# Patient Record
Sex: Male | Born: 1995 | Race: Black or African American | Hispanic: No | Marital: Single | Smoking: Former smoker
Health system: Southern US, Community
[De-identification: ages and names within clinical notes are randomized; demographics above are authoritative.]

## PROBLEM LIST (undated history)

## (undated) DIAGNOSIS — J45909 Unspecified asthma, uncomplicated: Secondary | ICD-10-CM

## (undated) HISTORY — PX: LEG SURGERY: SHX1003

---
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
## 2014-06-15 ENCOUNTER — Emergency Department (HOSPITAL_COMMUNITY)
Admission: EM | Admit: 2014-06-15 | Discharge: 2014-06-15 | Disposition: A | Discharge status: Home / Self Care | Payer: Medicaid Other | Attending: Emergency Medicine | Admitting: Emergency Medicine

## 2014-06-15 ENCOUNTER — Emergency Department (HOSPITAL_COMMUNITY): Payer: Medicaid Other

## 2014-06-15 ENCOUNTER — Encounter (HOSPITAL_COMMUNITY): Payer: Self-pay | Admitting: Emergency Medicine

## 2014-06-15 DIAGNOSIS — Z87891 Personal history of nicotine dependence: Secondary | ICD-10-CM | POA: Insufficient documentation

## 2014-06-15 DIAGNOSIS — S299XXA Unspecified injury of thorax, initial encounter: Secondary | ICD-10-CM | POA: Diagnosis not present

## 2014-06-15 DIAGNOSIS — Y998 Other external cause status: Secondary | ICD-10-CM | POA: Insufficient documentation

## 2014-06-15 DIAGNOSIS — S8001XA Contusion of right knee, initial encounter: Secondary | ICD-10-CM

## 2014-06-15 DIAGNOSIS — Y92410 Unspecified street and highway as the place of occurrence of the external cause: Secondary | ICD-10-CM | POA: Insufficient documentation

## 2014-06-15 DIAGNOSIS — S8991XA Unspecified injury of right lower leg, initial encounter: Secondary | ICD-10-CM | POA: Diagnosis present

## 2014-06-15 DIAGNOSIS — Z791 Long term (current) use of non-steroidal anti-inflammatories (NSAID): Secondary | ICD-10-CM | POA: Diagnosis not present

## 2014-06-15 DIAGNOSIS — Y9389 Activity, other specified: Secondary | ICD-10-CM | POA: Insufficient documentation

## 2014-06-15 DIAGNOSIS — J45909 Unspecified asthma, uncomplicated: Secondary | ICD-10-CM | POA: Diagnosis not present

## 2014-06-15 MED ORDER — IBUPROFEN 200 MG PO TABS
600.0000 mg | ORAL_TABLET | Freq: Once | ORAL | Status: AC
  Administered 2014-06-15: 600 mg via ORAL
  Filled 2014-06-15: qty 3
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
## 2014-06-15 NOTE — ED Notes (Signed)
MD at bedside. 

## 2014-06-15 NOTE — ED Notes (Signed)
MD Wofford at bedside.  

## 2014-06-15 NOTE — Discharge Instructions (Signed)
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
## 2014-06-15 NOTE — ED Notes (Signed)
Pt alert, oriented,and ambulatory upon DC. He was advised to follow up with a PCP if not better.
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
## 2014-06-15 NOTE — ED Notes (Signed)
Pt transported to XRAY °

## 2015-07-17 ENCOUNTER — Emergency Department (HOSPITAL_COMMUNITY)
Admission: EM | Admit: 2015-07-17 | Discharge: 2015-07-17 | Disposition: A | Discharge status: Home / Self Care | Payer: Medicaid Other | Attending: Emergency Medicine | Admitting: Emergency Medicine

## 2015-07-17 ENCOUNTER — Emergency Department (HOSPITAL_COMMUNITY): Payer: Medicaid Other

## 2015-07-17 ENCOUNTER — Encounter (HOSPITAL_COMMUNITY): Payer: Self-pay | Admitting: Emergency Medicine

## 2015-07-17 DIAGNOSIS — W25XXXA Contact with sharp glass, initial encounter: Secondary | ICD-10-CM | POA: Diagnosis not present

## 2015-07-17 DIAGNOSIS — Z23 Encounter for immunization: Secondary | ICD-10-CM | POA: Diagnosis not present

## 2015-07-17 DIAGNOSIS — S61421A Laceration with foreign body of right hand, initial encounter: Secondary | ICD-10-CM | POA: Diagnosis not present

## 2015-07-17 DIAGNOSIS — Y998 Other external cause status: Secondary | ICD-10-CM | POA: Diagnosis not present

## 2015-07-17 DIAGNOSIS — S50811A Abrasion of right forearm, initial encounter: Secondary | ICD-10-CM | POA: Insufficient documentation

## 2015-07-17 DIAGNOSIS — S60511A Abrasion of right hand, initial encounter: Secondary | ICD-10-CM | POA: Insufficient documentation

## 2015-07-17 DIAGNOSIS — S6991XA Unspecified injury of right wrist, hand and finger(s), initial encounter: Secondary | ICD-10-CM | POA: Diagnosis present

## 2015-07-17 DIAGNOSIS — Y92810 Car as the place of occurrence of the external cause: Secondary | ICD-10-CM | POA: Insufficient documentation

## 2015-07-17 DIAGNOSIS — Y9389 Activity, other specified: Secondary | ICD-10-CM | POA: Diagnosis not present

## 2015-07-17 DIAGNOSIS — F17210 Nicotine dependence, cigarettes, uncomplicated: Secondary | ICD-10-CM | POA: Diagnosis not present

## 2015-07-17 DIAGNOSIS — J45909 Unspecified asthma, uncomplicated: Secondary | ICD-10-CM | POA: Diagnosis not present

## 2015-07-17 DIAGNOSIS — T07XXXA Unspecified multiple injuries, initial encounter: Secondary | ICD-10-CM

## 2015-07-17 MED ORDER — LIDOCAINE HCL 1 % IJ SOLN
INTRAMUSCULAR | Status: AC
  Administered 2015-07-17: 20 mL
  Filled 2015-07-17: qty 20

## 2015-07-17 MED ORDER — TETANUS-DIPHTH-ACELL PERTUSSIS 5-2.5-18.5 LF-MCG/0.5 IM SUSP
0.5000 mL | Freq: Once | INTRAMUSCULAR | Status: AC
  Administered 2015-07-17: 0.5 mL via INTRAMUSCULAR
  Filled 2015-07-17: qty 0.5

## 2015-07-17 MED ORDER — LIDOCAINE HCL (PF) 1 % IJ SOLN: 5.0000 mL | Freq: Once | INTRAMUSCULAR | Status: DC

## 2015-07-17 MED ORDER — CEPHALEXIN 500 MG PO CAPS: 500.0000 mg | ORAL_CAPSULE | Freq: Four times a day (QID) | ORAL | Status: DC
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
## 2015-07-17 NOTE — Discharge Instructions (Signed)
Take the prescribed medication as directed.  Monitor hand for any signs of infection. Return to the ED for new or worsening symptoms.
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
## 2015-10-05 ENCOUNTER — Encounter (HOSPITAL_COMMUNITY): Payer: Self-pay | Admitting: *Deleted

## 2015-10-05 ENCOUNTER — Ambulatory Visit (HOSPITAL_COMMUNITY)
Admission: EM | Admit: 2015-10-05 | Discharge: 2015-10-05 | Disposition: A | Discharge status: Home / Self Care | Payer: No Typology Code available for payment source | Attending: Family Medicine | Admitting: Family Medicine

## 2015-10-05 DIAGNOSIS — S66902A Unspecified injury of unspecified muscle, fascia and tendon at wrist and hand level, left hand, initial encounter: Secondary | ICD-10-CM

## 2015-10-05 NOTE — Discharge Instructions (Signed)
Wear splint at all times, see orthopedist next week for recheck

## 2015-10-05 NOTE — ED Provider Notes (Signed)
CSN: [id number]     Arrival date & time 10/05/15  1157 History   First MD Initiated Contact with Patient 10/05/15 1222     Chief Complaint  Patient presents with  . Finger Injury   (Consider location/radiation/quality/duration/timing/severity/associated sxs/prior Treatment) Patient is a 20 y.o. male presenting with hand pain. The history is provided by the patient.  Hand Pain This is a new problem. The current episode started more than 1 week ago (2.5 wk ago jammed finger and reinjured with deformity , here for eval. ). The problem has been gradually worsening.    Past Medical History  Diagnosis Date  . Asthma     sport-induced   Past Surgical History  Procedure Laterality Date  . Leg surgery     No family history on file. Social History  Substance Use Topics  . Smoking status: Former Smoker    Types: Cigarettes  . Smokeless tobacco: None  . Alcohol Use: No    Review of Systems  Constitutional: Negative.   Musculoskeletal: Positive for joint swelling.  Skin: Negative.   All other systems reviewed and are negative.   Allergies  Review of patient's allergies indicates no known allergies.  Home Medications   Prior to Admission medications   Medication Sig Start Date End Date Taking? Authorizing Provider  albuterol (PROVENTIL HFA;VENTOLIN HFA) 108 (90 Base) MCG/ACT inhaler Inhale into the lungs every 6 (six) hours as needed for wheezing or shortness of breath.   Yes Historical Provider, MD  cephALEXin (KEFLEX) 500 MG capsule Take 1 capsule (500 mg total) by mouth 4 (four) times daily. 07/17/15   Garlon Hatchet, PA-C  ibuprofen (ADVIL,MOTRIN) 800 MG tablet Take 1 tablet (800 mg total) by mouth 3 (three) times daily. Patient not taking: Reported on 06/15/2014 10/11/13   Marisa Severin, MD  oxyCODONE-acetaminophen (PERCOCET/ROXICET) 5-325 MG per tablet Take 2 tablets by mouth every 4 (four) hours as needed for severe pain. Patient not taking: Reported on 06/15/2014 10/11/13   Marisa Severin, MD   Meds Ordered and Administered this Visit  Medications - No data to display  BP 123/81 mmHg  Pulse 63  Temp(Src) 98.4 F (36.9 C) (Oral)  Resp 16  SpO2 99% No data found.   Physical Exam  Constitutional: He is oriented to person, place, and time. He appears well-developed and well-nourished.  Musculoskeletal: He exhibits tenderness.       Hands: Neurological: He is alert and oriented to person, place, and time.  Skin: Skin is warm and dry.  Nursing note and vitals reviewed.   ED Course  Procedures (including critical care time)  Labs Review Labs Reviewed - No data to display  Imaging Review No results found.   Visual Acuity Review  Right Eye Distance:   Left Eye Distance:   Bilateral Distance:    Right Eye Near:   Left Eye Near:    Bilateral Near:         MDM   1. Injury of tendon of left hand, initial encounter    Splinted finger in extension.   Linna Hoff, MD 10/05/15 1239

## 2015-10-05 NOTE — ED Notes (Signed)
Reports jamming left 5th finger approx 2.5 wks ago; last week re-injured finger while "playing around with brother".  C/O continued pain; deformity to distal finger at DIP joint.
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
## 2022-02-15 DIAGNOSIS — I498 Other specified cardiac arrhythmias: Secondary | ICD-10-CM | POA: Diagnosis not present

## 2022-02-15 DIAGNOSIS — R109 Unspecified abdominal pain: Secondary | ICD-10-CM | POA: Diagnosis not present

## 2022-02-15 DIAGNOSIS — R1110 Vomiting, unspecified: Secondary | ICD-10-CM | POA: Diagnosis not present

## 2022-02-17 DIAGNOSIS — R109 Unspecified abdominal pain: Secondary | ICD-10-CM | POA: Diagnosis not present

## 2022-02-17 DIAGNOSIS — F22 Delusional disorders: Secondary | ICD-10-CM | POA: Diagnosis not present

## 2022-02-25 ENCOUNTER — Ambulatory Visit: Payer: Medicaid Other | Admitting: Internal Medicine

## 2022-02-25 ENCOUNTER — Encounter: Payer: Self-pay | Admitting: Internal Medicine

## 2022-02-25 VITALS — BP 108/62 | HR 82 | Temp 98.3°F | Resp 16 | Ht 72.0 in | Wt 142.0 lb

## 2022-02-25 DIAGNOSIS — M25531 Pain in right wrist: Secondary | ICD-10-CM | POA: Insufficient documentation

## 2022-02-25 DIAGNOSIS — S63529A Sprain of radiocarpal joint of unspecified wrist, initial encounter: Secondary | ICD-10-CM | POA: Diagnosis not present

## 2022-02-25 NOTE — Progress Notes (Unsigned)
   Office Visit  Subjective   Patient ID: Jerry Mcdaniel   DOB: 05/18/1995   Age: 26 y.o.   MRN: [id number]   Chief Complaint Chief Complaint  Patient presents with   New Patient (Initial Visit)     History of Present Illness Jerry Mcdaniel is a 26 yo male who comes in today to establish care and to discuss a right wrist injury.  He states he was playing basketball a little more than a month ago when he came down and landed with an outstretched hand palm first on the ground.  The day it happened, he had some swelling but no pain.  He started feeling pain days later.  He states this worsened and he presented to the [facility] on 02/03/2022.  They did a xrays of his right wrist where there was no fracture or dislocation.  They asked him to wear a wrist brace and started him on naproxen 500mg  BID which he took that for 10 days.  He states that this did not help.  He states he iced it a few times but today he states there has been no improvement in his pain.  If he turns his wrist in a certain way, he will have a dull aching pain.       Past Medical History Past Medical History:  Diagnosis Date   Asthma    sport-induced     Allergies No Known Allergies   Review of Systems Review of Systems  Musculoskeletal:  Positive for joint pain. Negative for back pain and neck pain.  Neurological:  Negative for dizziness, weakness and headaches.       Objective:    Vitals BP 108/62   Pulse 82   Temp 98.3 F (36.8 C)   Resp 16   Ht 6' (1.829 m)   Wt 142 lb 0.2 oz (64.4 kg)   SpO2 98%   BMI 19.26 kg/m    Physical Examination Physical Exam Constitutional:      Appearance: Normal appearance.  Musculoskeletal:     Comments: He has pain over the distal aspect of his ulnar area.  Neurological:     Mental Status: He is alert.        Assessment & Plan:   Right wrist pain I spoke to Dr. on the phone and he wants to see the patient in the morning for evaluation.     No follow-ups on file.   Loralie Champagne, MD

## 2022-02-25 NOTE — Assessment & Plan Note (Signed)
I spoke to Dr. Loralie Champagne on the phone and he wants to see the patient in the morning for evaluation.

## 2022-02-26 DIAGNOSIS — S63521A Sprain of radiocarpal joint of right wrist, initial encounter: Secondary | ICD-10-CM | POA: Diagnosis not present

## 2022-08-18 DIAGNOSIS — M546 Pain in thoracic spine: Secondary | ICD-10-CM | POA: Diagnosis not present

## 2022-08-18 DIAGNOSIS — S39012A Strain of muscle, fascia and tendon of lower back, initial encounter: Secondary | ICD-10-CM | POA: Diagnosis not present

## 2022-10-19 ENCOUNTER — Encounter (HOSPITAL_COMMUNITY): Payer: Self-pay

## 2022-10-19 ENCOUNTER — Ambulatory Visit (HOSPITAL_COMMUNITY)
Admission: EM | Admit: 2022-10-19 | Discharge: 2022-10-19 | Disposition: A | Discharge status: Home / Self Care | Payer: Medicaid Other | Attending: Physician Assistant | Admitting: Physician Assistant

## 2022-10-19 DIAGNOSIS — S39012D Strain of muscle, fascia and tendon of lower back, subsequent encounter: Secondary | ICD-10-CM | POA: Diagnosis not present

## 2022-10-19 DIAGNOSIS — M545 Low back pain, unspecified: Secondary | ICD-10-CM

## 2022-10-19 MED ORDER — LIDOCAINE 5 % EX PTCH: 1.0000 | MEDICATED_PATCH | CUTANEOUS | 0 refills | Status: DC

## 2022-10-19 MED ORDER — IBUPROFEN 800 MG PO TABS: 800.0000 mg | ORAL_TABLET | Freq: Three times a day (TID) | ORAL | 0 refills | Status: DC

## 2022-10-19 MED ORDER — METHOCARBAMOL 500 MG PO TABS: 500.0000 mg | ORAL_TABLET | Freq: Two times a day (BID) | ORAL | 0 refills | Status: DC

## 2022-10-19 NOTE — ED Provider Notes (Signed)
[facility]    CSN: [id number] Arrival date & time: 10/19/22  8469      History   Chief Complaint Chief Complaint  Patient presents with   Back Pain    HPI Jerry Mcdaniel is a 27 y.o. male.   Patient presents today with a prolonged history of intermittent back pain.  He reports that this has been ongoing ever since he got out of school and he will intermittently have flares of this.  Currently pain is rated 7 on a 0-10 pain scale, described as aching, no aggravating relieving factors identified.  He denies any recent trauma but does state that he works a physically demanding job in [employer] and this tends to exacerbate his symptoms.  He has tried Tylenol and ibuprofen with minimal improvement of symptoms.  He was seen by different urgent care on 08/18/2022 and given tizanidine that was ineffective.  He denies any bowel/bladder incontinence, lower extremity weakness, saddle anesthesia.  Denies previous injury or surgery involving his back.  Denies any history of malignancy.   Back Pain Associated symptoms: no abdominal pain, no fever, no numbness and no weakness     Past Medical History:  Diagnosis Date   Asthma    sport-induced    Patient Active Problem List   Diagnosis Date Noted   Right wrist pain 02/25/2022    Past Surgical History:  Procedure Laterality Date   LEG SURGERY         Home Medications    Prior to Admission medications   Medication Sig Start Date End Date Taking? Authorizing Provider  ibuprofen (ADVIL) 800 MG tablet Take 1 tablet (800 mg total) by mouth 3 (three) times daily. 10/19/22  Yes Percy Comp K, PA-C  lidocaine (LIDODERM) 5 % Place 1 patch onto the skin daily. Remove & Discard patch within 12 hours or as directed by MD 10/19/22  Yes Samia Kukla, Denny Peon K, PA-C  methocarbamol (ROBAXIN) 500 MG tablet Take 1 tablet (500 mg total) by mouth 2 (two) times daily. 10/19/22  Yes Arie Gable, Noberto Retort, PA-C    Family History Family History  Family history  unknown: Yes    Social History Social History   Tobacco Use   Smoking status: Former   Smokeless tobacco: Current  Substance Use Topics   Alcohol use: No   Drug use: No    Types: Marijuana    Comment: Hx marijuana use per record     Allergies   Patient has no known allergies.   Review of Systems Review of Systems  Constitutional:  Negative for activity change, appetite change, fatigue and fever.  Gastrointestinal:  Negative for abdominal pain, diarrhea, nausea and vomiting.  Musculoskeletal:  Positive for back pain. Negative for arthralgias and myalgias.  Neurological:  Negative for weakness and numbness.     Physical Exam Triage Vital Signs ED Triage Vitals  Encounter Vitals Group     BP 10/19/22 1014 113/75     Systolic BP Percentile --      Diastolic BP Percentile --      Pulse Rate 10/19/22 1014 70     Resp 10/19/22 1014 16     Temp 10/19/22 1014 98.7 F (37.1 C)     Temp Source 10/19/22 1014 Oral     SpO2 10/19/22 1014 97 %     Weight 10/19/22 1014 142 lb (64.4 kg)     Height 10/19/22 1014 6' (1.829 m)     Head Circumference --  Peak Flow --      Pain Score 10/19/22 1013 7     Pain Loc --      Pain Education --      Exclude from Growth Chart --    No data found.  Updated Vital Signs BP 113/75 (BP Location: Left Arm)   Pulse 70   Temp 98.7 F (37.1 C) (Oral)   Resp 16   Ht 6' (1.829 m)   Wt 142 lb (64.4 kg)   SpO2 97%   BMI 19.26 kg/m   Visual Acuity Right Eye Distance:   Left Eye Distance:   Bilateral Distance:    Right Eye Near:   Left Eye Near:    Bilateral Near:     Physical Exam Vitals reviewed.  Constitutional:      General: He is awake.     Appearance: Normal appearance. He is well-developed. He is not ill-appearing.     Comments: Very pleasant male stated age no acute distress sitting comfortably in exam room  HENT:     Head: Normocephalic and atraumatic.     Mouth/Throat:     Pharynx: No oropharyngeal exudate,  posterior oropharyngeal erythema or uvula swelling.  Cardiovascular:     Rate and Rhythm: Normal rate and regular rhythm.     Heart sounds: Normal heart sounds, S1 normal and S2 normal. No murmur heard. Pulmonary:     Effort: Pulmonary effort is normal.     Breath sounds: Normal breath sounds. No stridor. No wheezing, rhonchi or rales.     Comments: Clear to auscultation bilaterally Musculoskeletal:     Cervical back: No tenderness or bony tenderness.     Thoracic back: Tenderness present. No bony tenderness.     Lumbar back: Tenderness present. No bony tenderness. Negative right straight leg raise test and negative left straight leg raise test.     Comments: Back: No pain precaution of vertebrae.  No deformity or step-off noted.  Mild/palpation throughout thoracic and lumbar paraspinal muscles.  No spasm noted.  Negative straight leg raise bilaterally.  Negative Faber bilaterally.  Neurological:     Mental Status: He is alert.  Psychiatric:        Behavior: Behavior is cooperative.      UC Treatments / Results  Labs (all labs ordered are listed, but only abnormal results are displayed) Labs Reviewed - No data to display  EKG   Radiology No results found.  Procedures Procedures (including critical care time)  Medications Ordered in UC Medications - No data to display  Initial Impression / Assessment and Plan / UC Course  I have reviewed the triage vital signs and the nursing notes.  Pertinent labs & imaging results that were available during my care of the patient were reviewed by me and considered in my medical decision making (see chart for details).     Patient is well-appearing, afebrile, nontoxic, nontachycardic.  Patient denies any alarm symptoms that warrant emergent evaluation or imaging.  Plain films were deferred as he denies any recent trauma and has no focal bony tenderness.  Suspect muscular etiology.  He was started on ibuprofen three times a day with  instruction to take additional NSAIDs with this medication due to risk of GI bleeding.  Can use acetaminophen/Tylenol for breakthrough pain.  Was prescribed Robaxin up to 2 times a day.  Discussed this can be sedating and he is not to drive or drink alcohol while taking it.  He was also given lidocaine patches for  symptom relief and we discussed that these should be placed for 12 hours during the day and then remove for 12 hours at night; use only 1 patch per 24 hours.  Recommended conservative treatment measures including heat, rest, stretch.  He is to follow-up with sports medicine if his symptoms or not improving quickly and was given contact information for local provider with instruction to call to schedule an appointment.  Discussed that if he has any worsening symptoms including severe pain, bowel/bladder incontinence, lower extremity weakness, saddle anesthesia he needs to be seen immediately.  Strict return precautions given.  Work excuse note provided.   Final Clinical Impressions(s) / UC Diagnoses   Final diagnoses:  Strain of lumbar region, subsequent encounter  Acute bilateral low back pain without sciatica     Discharge Instructions      I believe that you have injured the muscles in your back.  Please start ibuprofen 3 times a day.  Do not take other NSAIDs with this medication including aspirin, ibuprofen/Advil, naproxen/Aleve.  You can use acetaminophen/Tylenol for breakthrough pain.  Take Robaxin up to 2 times a day.  This will make you sleepy so do not drive or drink alcohol taking it.  Apply lidocaine patch for 12 hours during the day; remove this for 12 hours at night.  Use only 1 patch per 24 hours.  I recommend you follow-up with sports medicine; call to schedule an appointment.  If anything worsens and you have severe pain, going to the bathroom on yourself without noticing it, numbness or tingling in your legs, weakness in your legs you need to be seen immediately.  Someone  should call you to schedule an appoint with primary care.      ED Prescriptions     Medication Sig Dispense Auth. Provider   ibuprofen (ADVIL) 800 MG tablet Take 1 tablet (800 mg total) by mouth 3 (three) times daily. 21 tablet Ramisa Duman K, PA-C   methocarbamol (ROBAXIN) 500 MG tablet Take 1 tablet (500 mg total) by mouth 2 (two) times daily. 20 tablet Cordelia Bessinger K, PA-C   lidocaine (LIDODERM) 5 % Place 1 patch onto the skin daily. Remove & Discard patch within 12 hours or as directed by MD 30 patch Treyden Hakim K, PA-C      PDMP not reviewed this encounter.   Jeani Hawking, PA-C 10/19/22 1102

## 2022-10-19 NOTE — ED Triage Notes (Signed)
Patient here today with c/o LB pain since Thursday. Patient has a h/o LB pain off and on since being out of school, playing sports. 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 No known injury. He tried taking Tylenol and IBU with no relief.

## 2022-10-19 NOTE — Discharge Instructions (Signed)
I believe that you have injured the muscles in your back.  Please start ibuprofen 3 times a day.  Do not take other NSAIDs with this medication including aspirin, ibuprofen/Advil, naproxen/Aleve.  You can use acetaminophen/Tylenol for breakthrough pain.  Take Robaxin up to 2 times a day.  This will make you sleepy so do not drive or drink alcohol taking it.  Apply lidocaine patch for 12 hours during the day; remove this for 12 hours at night.  Use only 1 patch per 24 hours.  I recommend you follow-up with sports medicine; call to schedule an appointment.  If anything worsens and you have severe pain, going to the bathroom on yourself without noticing it, numbness or tingling in your legs, weakness in your legs you need to be seen immediately.  Someone should call you to schedule an appoint with primary care.

## 2023-05-03 ENCOUNTER — Ambulatory Visit: Payer: Medicaid Other | Admitting: Internal Medicine

## 2023-05-07 ENCOUNTER — Ambulatory Visit: Payer: Medicaid Other | Admitting: Internal Medicine

## 2023-05-18 ENCOUNTER — Ambulatory Visit: Payer: Medicaid Other | Admitting: Internal Medicine

## 2023-05-26 ENCOUNTER — Ambulatory Visit: Admitting: Internal Medicine

## 2023-06-04 ENCOUNTER — Encounter: Payer: Self-pay | Admitting: Internal Medicine

## 2023-06-04 ENCOUNTER — Ambulatory Visit: Admitting: Internal Medicine

## 2023-06-04 VITALS — BP 114/76 | HR 78 | Temp 98.4°F | Resp 16 | Ht 69.0 in | Wt 136.2 lb

## 2023-06-04 DIAGNOSIS — D696 Thrombocytopenia, unspecified: Secondary | ICD-10-CM

## 2023-06-04 DIAGNOSIS — J45990 Exercise induced bronchospasm: Secondary | ICD-10-CM | POA: Diagnosis not present

## 2023-06-04 DIAGNOSIS — R739 Hyperglycemia, unspecified: Secondary | ICD-10-CM

## 2023-06-04 DIAGNOSIS — Z0000 Encounter for general adult medical examination without abnormal findings: Secondary | ICD-10-CM | POA: Diagnosis not present

## 2023-06-04 DIAGNOSIS — Z6820 Body mass index (BMI) 20.0-20.9, adult: Secondary | ICD-10-CM | POA: Diagnosis not present

## 2023-06-04 NOTE — Assessment & Plan Note (Signed)
I want him to eat healthy and continue to exercise.

## 2023-06-04 NOTE — Assessment & Plan Note (Signed)
 He was noted to have some hyperglycemia on labs in 02/2022.  We will do a CMP and HgBA1c and FLP.

## 2023-06-04 NOTE — Assessment & Plan Note (Signed)
 He is really not effected by EIA at this time.  It is probably resolved.

## 2023-06-04 NOTE — Progress Notes (Signed)
 Office Visit  Subjective   Patient ID: HULET EHRMANN   DOB: 1995/10/23   Age: 28 y.o.   MRN: [id number]   Chief Complaint Chief Complaint  Patient presents with   Follow-up     History of Present Illness Mr. Cauthorn is a 28 yo male who comes in today for an annual exam.  His PMHx is significant for exercise induced asthma.  His last eye exam was done about 1 year ago and denies any problems with his vision.  There is no family history of colorectal cancer or prostate cancer.  He has never had a colonoscopy.  He denies any problems with urinating.  He does not exercise regularly but he does work [employer] representative.  He does not smoke.  There is no family history of CAD or stroke, diabetes or high cholesterol.  He denies any depression or anxiety.    He states he was diagnosed exerise induced asthma when he was about 28 years old.  He states when he would exercise, he would get wheezing, SOB and chest pain.  They placed him on albuterol inhaler back then and told him to use this before he exercises.  He has not used albuterol in over 10 years.  He denies any wheezing, SOB or other problems.  He does not wheeze when he gets respiratory illnesses.   I did see him in 02/2023 for a right wrist injury.  He was playing basketball a little more than a month prior when he came down and landed with an outstretched hand palm first on the ground.  The day it happened, he had some swelling but no pain.  He started feeling pain days later.  He states this worsened and he presented to the [facility] on 02/03/2022.  They did a xrays of his right wrist where there was no fracture or dislocation.  They asked him to wear a wrist brace and started him on naproxen 500mg  BID which he took that for 10 days.  He states that this did not help.  He states he iced it a few times but there was no improvement in his pain.  I did send him to ortho who repeated xrays and continued on his wrist brace and the patient states  this did resolve.  The patient was found at 28 years of age to have a soft tissue mass in left thigh.  They did a MRI of his thigh that found a 6.3cm mass in the left adductor longus muscle.  They biopsied this area and he tells me this was benign.  They noted at that time he had very mild thrombocytopenia with a value of 146.  This was all done in 2013.     Past Medical History Past Medical History:  Diagnosis Date   Asthma    sport-induced     Allergies Not on File   Medications No current outpatient medications on file.   Review of Systems Review of Systems  Constitutional:  Negative for chills, fever, malaise/fatigue and weight loss.  Eyes:  Negative for blurred vision and double vision.  Respiratory:  Negative for cough, hemoptysis, shortness of breath and wheezing.   Cardiovascular:  Negative for chest pain, palpitations and leg swelling.  Gastrointestinal:  Negative for abdominal pain, constipation, diarrhea, heartburn, nausea and vomiting.  Genitourinary:  Negative for frequency and hematuria.  Musculoskeletal:  Negative for myalgias.  Skin:  Negative for itching and rash.  Neurological:  Negative for dizziness, weakness and  headaches.  Endo/Heme/Allergies:  Negative for polydipsia.  Psychiatric/Behavioral:  Negative for depression. The patient is not nervous/anxious.        Objective:    Vitals BP 114/76   Pulse 78   Temp 98.4 F (36.9 C)   Resp 16   Ht 5\' 9"  (1.753 m)   Wt 136 lb 3.2 oz (61.8 kg)   SpO2 98%   BMI 20.11 kg/m    Physical Examination Physical Exam     Assessment & Plan:   Exercise-induced asthma He is really not effected by EIA at this time.  It is probably resolved.  Thrombocytopenia (HCC) He was noted in 2013 to have mild thrombocytopenia.  I looked at his labs when he was at [facility] in 2023 and he still had some mild thrombocytopenia.  We will repeat his CBC today.  Annual physical exam Health maintenance discussed.  We will obtain  some yearly labs.  Hyperglycemia He was noted to have some hyperglycemia on labs in 02/2022.  We will do a CMP and HgBA1c and FLP.  BMI 20.0-20.9, adult I want him to eat healthy and continue to exercise.    Return in about 1 year (around 06/05/2024) for annual.   Crist Fat, MD

## 2023-06-04 NOTE — Assessment & Plan Note (Signed)
 He was noted in 2013 to have mild thrombocytopenia.  I looked at his labs when he was at [facility] in 2023 and he still had some mild thrombocytopenia.  We will repeat his CBC today.

## 2023-06-04 NOTE — Assessment & Plan Note (Signed)
 Health maintenance discussed.  We will obtain some yearly labs.

## 2023-06-05 LAB — CBC WITH DIFFERENTIAL/PLATELET
Basophils Absolute: 0 10*3/uL (ref 0.0–0.2)
Basos: 0 %
EOS (ABSOLUTE): 0.1 10*3/uL (ref 0.0–0.4)
Eos: 1 %
Hematocrit: 44.5 % (ref 37.5–51.0)
Hemoglobin: 15 g/dL (ref 13.0–17.7)
Immature Grans (Abs): 0 10*3/uL (ref 0.0–0.1)
Immature Granulocytes: 0 %
Lymphocytes Absolute: 2.6 10*3/uL (ref 0.7–3.1)
Lymphs: 31 %
MCH: 31.8 pg (ref 26.6–33.0)
MCHC: 33.7 g/dL (ref 31.5–35.7)
MCV: 94 fL (ref 79–97)
Monocytes Absolute: 0.6 10*3/uL (ref 0.1–0.9)
Monocytes: 7 %
Neutrophils Absolute: 5 10*3/uL (ref 1.4–7.0)
Neutrophils: 61 %
Platelets: 143 10*3/uL — ABNORMAL LOW (ref 150–450)
RBC: 4.72 x10E6/uL (ref 4.14–5.80)
RDW: 12.8 % (ref 11.6–15.4)
WBC: 8.4 10*3/uL (ref 3.4–10.8)

## 2023-06-05 LAB — CMP14 + ANION GAP
ALT: 20 IU/L (ref 0–44)
AST: 23 IU/L (ref 0–40)
Albumin: 4.7 g/dL (ref 4.3–5.2)
Alkaline Phosphatase: 73 IU/L (ref 44–121)
Anion Gap: 14 mmol/L (ref 10.0–18.0)
BUN/Creatinine Ratio: 13 (ref 9–20)
BUN: 12 mg/dL (ref 6–20)
Bilirubin Total: 0.3 mg/dL (ref 0.0–1.2)
CO2: 24 mmol/L (ref 20–29)
Calcium: 9.2 mg/dL (ref 8.7–10.2)
Chloride: 101 mmol/L (ref 96–106)
Creatinine, Ser: 0.92 mg/dL (ref 0.76–1.27)
Globulin, Total: 2.5 g/dL (ref 1.5–4.5)
Glucose: 85 mg/dL (ref 70–99)
Potassium: 4 mmol/L (ref 3.5–5.2)
Sodium: 139 mmol/L (ref 134–144)
Total Protein: 7.2 g/dL (ref 6.0–8.5)
eGFR: 116 mL/min/{1.73_m2} (ref >59)

## 2023-06-05 LAB — LIPID PANEL
Chol/HDL Ratio: 2.6 ratio (ref 0.0–5.0)
Cholesterol, Total: 132 mg/dL (ref 100–199)
HDL: 50 mg/dL (ref >39)
LDL Chol Calc (NIH): 67 mg/dL (ref 0–99)
Triglycerides: 73 mg/dL (ref 0–149)
VLDL Cholesterol Cal: 15 mg/dL (ref 5–40)

## 2023-06-05 LAB — HEMOGLOBIN A1C
Est. average glucose Bld gHb Est-mCnc: 123 mg/dL
Hgb A1c MFr Bld: 5.9 % — ABNORMAL HIGH (ref 4.8–5.6)

## 2023-06-05 LAB — TSH: TSH: 2.52 u[IU]/mL (ref 0.450–4.500)

## 2023-06-14 NOTE — Progress Notes (Signed)
 His platelet count is a little low but nothing to be concerned about. We will follow over time. He is developing some prediabetes. Exercise and cut sugars back in his diet. Other labs look good.  Patient is aware of labs
